# Patient Record
Sex: Male | Born: 1980 | Race: Black or African American | Hispanic: No | Marital: Married | Smoking: Never smoker
Health system: Southern US, Community
[De-identification: ages and names within clinical notes are randomized; demographics above are authoritative.]

## PROBLEM LIST (undated history)

## (undated) HISTORY — PX: TESTICLE REMOVAL: SHX68

---

## 2005-06-18 ENCOUNTER — Emergency Department (HOSPITAL_COMMUNITY): Admission: EM | Admit: 2005-06-18 | Discharge: 2005-06-18 | Payer: Self-pay | Admitting: Emergency Medicine

## 2005-06-22 ENCOUNTER — Emergency Department (HOSPITAL_COMMUNITY): Admission: EM | Admit: 2005-06-22 | Discharge: 2005-06-23 | Payer: Self-pay | Admitting: Emergency Medicine

## 2010-09-05 ENCOUNTER — Emergency Department (HOSPITAL_COMMUNITY): Admission: EM | Admit: 2010-09-05 | Discharge: 2010-09-05 | Payer: Self-pay | Admitting: Emergency Medicine

## 2011-02-13 LAB — URINALYSIS, ROUTINE W REFLEX MICROSCOPIC
Hgb urine dipstick: NEGATIVE
Ketones, ur: NEGATIVE mg/dL
Protein, ur: NEGATIVE mg/dL
Urobilinogen, UA: 1 mg/dL (ref 0.0–1.0)
pH: 7 (ref 5.0–8.0)

## 2011-02-13 LAB — GC/CHLAMYDIA PROBE AMP, GENITAL: Chlamydia, DNA Probe: NEGATIVE

## 2013-08-08 ENCOUNTER — Encounter (HOSPITAL_COMMUNITY): Payer: Self-pay | Admitting: *Deleted

## 2013-08-08 ENCOUNTER — Emergency Department (HOSPITAL_COMMUNITY): Payer: BC Managed Care – PPO

## 2013-08-08 ENCOUNTER — Emergency Department (HOSPITAL_COMMUNITY)
Admission: EM | Admit: 2013-08-08 | Discharge: 2013-08-08 | Disposition: A | Discharge status: Home / Self Care | Payer: BC Managed Care – PPO | Attending: Emergency Medicine | Admitting: Emergency Medicine

## 2013-08-08 DIAGNOSIS — IMO0002 Reserved for concepts with insufficient information to code with codable children: Secondary | ICD-10-CM | POA: Insufficient documentation

## 2013-08-08 DIAGNOSIS — S139XXA Sprain of joints and ligaments of unspecified parts of neck, initial encounter: Secondary | ICD-10-CM | POA: Insufficient documentation

## 2013-08-08 DIAGNOSIS — S161XXA Strain of muscle, fascia and tendon at neck level, initial encounter: Secondary | ICD-10-CM

## 2013-08-08 DIAGNOSIS — Y9289 Other specified places as the place of occurrence of the external cause: Secondary | ICD-10-CM | POA: Insufficient documentation

## 2013-08-08 DIAGNOSIS — Y9389 Activity, other specified: Secondary | ICD-10-CM | POA: Insufficient documentation

## 2013-08-08 DIAGNOSIS — Z79899 Other long term (current) drug therapy: Secondary | ICD-10-CM | POA: Insufficient documentation

## 2013-08-08 MED ORDER — METHOCARBAMOL 750 MG PO TABS: 750.0000 mg | ORAL_TABLET | Freq: Four times a day (QID) | ORAL | Status: DC | PRN

## 2013-08-08 MED ORDER — NAPROXEN 500 MG PO TABS: 500.0000 mg | ORAL_TABLET | Freq: Two times a day (BID) | ORAL | Status: DC | PRN

## 2013-08-08 MED ORDER — HYDROCODONE-ACETAMINOPHEN 5-325 MG PO TABS
2.0000 | ORAL_TABLET | Freq: Once | ORAL | Status: AC
  Administered 2013-08-08: 2 via ORAL
  Filled 2013-08-08: qty 2

## 2013-08-08 MED ORDER — DIAZEPAM 5 MG PO TABS
10.0000 mg | ORAL_TABLET | Freq: Once | ORAL | Status: AC
  Administered 2013-08-08: 10 mg via ORAL
  Filled 2013-08-08: qty 2

## 2013-08-08 MED ORDER — HYDROCODONE-ACETAMINOPHEN 5-325 MG PO TABS: 1.0000 | ORAL_TABLET | Freq: Four times a day (QID) | ORAL | Status: DC | PRN
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
## 2013-08-11 NOTE — ED Provider Notes (Signed)
Medical screening examination/treatment/procedure(s) were performed by non-physician practitioner and as supervising physician I was immediately available for consultation/collaboration.   Anyssa Sharpless M Daleon Willinger, DO 08/11/13 2259 
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
## 2016-09-13 ENCOUNTER — Ambulatory Visit (INDEPENDENT_AMBULATORY_CARE_PROVIDER_SITE_OTHER): Payer: Self-pay | Admitting: Family Medicine

## 2016-09-13 VITALS — BP 126/70 | HR 62 | Temp 97.3°F | Resp 18 | Ht 69.0 in | Wt 168.0 lb

## 2016-09-13 DIAGNOSIS — Z024 Encounter for examination for driving license: Secondary | ICD-10-CM

## 2016-09-13 NOTE — Progress Notes (Signed)
Commercial Driver Medical Examination   Steven Terry is a 35 y.o. male who presents today for a commercial driver fitness determination physical exam. The patient reports no problems. The following portions of the patient's history were reviewed and updated as appropriate: allergies, current medications, past family history, past medical history, past social history, past surgical history and problem list. Review of Systems A comprehensive review of systems was negative.   Objective:    Vision:   Visual Acuity Screening   Right eye Left eye Both eyes  Without correction: 20/13 20/13 20/13   With correction:     Comments: Titmus 85% Colors: 6/6      Applicant can recognize and distinguish among traffic control signals and devices showing standard red, green, and amber colors.     Monocular Vision?: No   Hearing:  Hearing Screening Comments: Whisper test 9 ft  BP 126/70   Pulse 62   Temp 97.3 F (36.3 C) (Oral)   Resp 18   Ht 5\' 9"  (1.753 m)   Wt 168 lb (76.2 kg)   SpO2 100%   BMI 24.81 kg/m   General Appearance:    Alert, cooperative, no distress, appears stated age  Head:    Normocephalic, without obvious abnormality, atraumatic  Eyes:    PERRL, conjunctiva/corneas clear, EOM's intact, fundi    benign, both eyes       Ears:    Normal TM's and external ear canals, both ears  Nose:   Nares normal, septum midline, mucosa normal, no drainage    or sinus tenderness  Throat:   Lips, mucosa, and tongue normal; teeth and gums normal  Neck:   Supple, symmetrical, trachea midline, no adenopathy;       thyroid:  No enlargement/tenderness/nodules; no carotid   bruit or JVD  Back:     Symmetric, no curvature, ROM normal, no CVA tenderness  Lungs:     Clear to auscultation bilaterally, respirations unlabored  Chest wall:    No tenderness or deformity  Heart:    Regular rate and rhythm, II/VI systolic ejection murmur LUSB - no alarm sxs  Abdomen:     Soft, non-tender, bowel  sounds active all four quadrants,    no masses, no organomegaly        Extremities:   Extremities normal, atraumatic, no cyanosis or edema  Pulses:   2+ and symmetric all extremities  Skin:   Skin color, texture, turgor normal, no rashes or lesions  Lymph nodes:   Cervical, supraclavicular, and axillary nodes normal  Neurologic:   CNII-XII intact. Normal strength, sensation and reflexes      throughout    Labs:   Assessment:    Healthy male exam.  Meets standards in 9349 CFR 391.41;  qualifies for 2 year certificate.    Plan:    Medical examiners certificate completed and printed. Return as needed.

## 2016-09-13 NOTE — Patient Instructions (Signed)
     IF you received an x-ray today, you will receive an invoice from [facility]. Please contact [facility] at [phone] with questions or concerns regarding your invoice.   IF you received labwork today, you will receive an invoice from [facility]. Please contact [facility] at [phone] with questions or concerns regarding your invoice.   Our billing staff will not be able to assist you with questions regarding bills from these companies.  You will be contacted with the lab results as soon as they are available. The fastest way to get your results is to activate your My Chart account. Instructions are located on the last page of this paperwork. If you have not heard from us regarding the results in 2 weeks, please contact this office.      

## 2019-04-12 ENCOUNTER — Encounter (HOSPITAL_COMMUNITY): Payer: Self-pay

## 2019-04-12 ENCOUNTER — Ambulatory Visit (HOSPITAL_COMMUNITY)
Admission: EM | Admit: 2019-04-12 | Discharge: 2019-04-12 | Disposition: A | Discharge status: Home / Self Care | Payer: BLUE CROSS/BLUE SHIELD | Attending: Family Medicine | Admitting: Family Medicine

## 2019-04-12 ENCOUNTER — Other Ambulatory Visit: Payer: Self-pay

## 2019-04-12 ENCOUNTER — Ambulatory Visit (INDEPENDENT_AMBULATORY_CARE_PROVIDER_SITE_OTHER): Payer: BLUE CROSS/BLUE SHIELD

## 2019-04-12 ENCOUNTER — Telehealth: Payer: BLUE CROSS/BLUE SHIELD | Admitting: Family Medicine

## 2019-04-12 DIAGNOSIS — Z20828 Contact with and (suspected) exposure to other viral communicable diseases: Secondary | ICD-10-CM

## 2019-04-12 DIAGNOSIS — R509 Fever, unspecified: Secondary | ICD-10-CM | POA: Diagnosis not present

## 2019-04-12 DIAGNOSIS — R5383 Other fatigue: Secondary | ICD-10-CM | POA: Diagnosis not present

## 2019-04-12 DIAGNOSIS — Z20822 Contact with and (suspected) exposure to covid-19: Secondary | ICD-10-CM

## 2019-04-12 DIAGNOSIS — R0602 Shortness of breath: Secondary | ICD-10-CM | POA: Diagnosis not present

## 2019-04-12 NOTE — Discharge Instructions (Addendum)
I am unable to order COVID testing for you. I think there is a good likelihood this is a COVID-19 infection [facility] testing on select individuals.  Call [phone] for information If you have worsening shortness of breath and illness, you should go to the emergency room where testing is done For now you need to quarantine at home.  Avoid contact with your family I will write a note for your work Do not take ibuprofen or Aleve.  Is preferred to take Tylenol for pain and fever Rest and drink plenty of fluids You cannot return to work until you have had no fever for 72 hours, and at least 7 days from her symptom onset I recommend the [facility] website for an E- visit if you have questions, problems, or need an extension of a work note    Person Under Monitoring Name: Steven Terry  Location: [address] [place] [id number]   Infection Prevention Recommendations for Individuals Confirmed to have, or Being Evaluated for, 2019 Novel Coronavirus (COVID-19) Infection Who Receive Care at Home  Individuals who are confirmed to have, or are being evaluated for, COVID-19 should follow the prevention steps below until a healthcare provider or local or state health department says they can return to normal activities.  Stay home except to get medical care You should restrict activities outside your home, except for getting medical care. Do not go to work, school, or public areas, and do not use public transportation or taxis.  Call ahead before visiting your doctor Before your medical appointment, call the healthcare provider and tell them that you have, or are being evaluated for, COVID-19 infection. This will help the healthcare providers office take steps to keep other people from getting infected. Ask your healthcare provider to call the local or state health department.  Monitor your symptoms Seek prompt medical attention if your  illness is worsening (e.g., difficulty breathing). Before going to your medical appointment, call the healthcare provider and tell them that you have, or are being evaluated for, COVID-19 infection. Ask your healthcare provider to call the local or state health department.  Wear a facemask You should wear a facemask that covers your nose and mouth when you are in the same room with other people and when you visit a healthcare provider. People who live with or visit you should also wear a facemask while they are in the same room with you.  Separate yourself from other people in your home As much as possible, you should stay in a different room from other people in your home. Also, you should use a separate bathroom, if available.  Avoid sharing household items You should not share dishes, drinking glasses, cups, eating utensils, towels, bedding, or other items with other people in your home. After using these items, you should wash them thoroughly with soap and water.  Cover your coughs and sneezes Cover your mouth and nose with a tissue when you cough or sneeze, or you can cough or sneeze into your sleeve. Throw used tissues in a lined trash can, and immediately wash your hands with soap and water for at least 20 seconds or use an alcohol-based hand rub.  Wash your [employer] Wash your hands often and thoroughly with soap and water for at least 20 seconds. You can use an alcohol-based hand sanitizer if soap and water are not available and if your hands are not visibly dirty. Avoid touching your eyes, nose,  and mouth with unwashed hands.   Prevention Steps for Caregivers and Household Members of Individuals Confirmed to have, or Being Evaluated for, COVID-19 Infection Being Cared for in the Home  If you live with, or provide care at home for, a person confirmed to have, or being evaluated for, COVID-19 infection please follow these guidelines to prevent infection:  Follow healthcare  providers instructions Make sure that you understand and can help the patient follow any healthcare provider instructions for all care.  Provide for the patients basic needs You should help the patient with basic needs in the home and provide support for getting groceries, prescriptions, and other personal needs.  Monitor the patients symptoms If they are getting sicker, call his or her medical provider and tell them that the patient has, or is being evaluated for, COVID-19 infection. This will help the healthcare providers office take steps to keep other people from getting infected. Ask the healthcare provider to call the local or state health department.  Limit the number of people who have contact with the patient If possible, have only one caregiver for the patient. Other household members should stay in another home or place of residence. If this is not possible, they should stay in another room, or be separated from the patient as much as possible. Use a separate bathroom, if available. Restrict visitors who do not have an essential need to be in the home.  Keep older adults, very young children, and other sick people away from the patient Keep older adults, very young children, and those who have compromised immune systems or chronic health conditions away from the patient. This includes people with chronic heart, lung, or kidney conditions, diabetes, and cancer.  Ensure good ventilation Make sure that shared spaces in the home have good air flow, such as from an air conditioner or an opened window, weather permitting.  Wash your hands often Wash your hands often and thoroughly with soap and water for at least 20 seconds. You can use an alcohol based hand sanitizer if soap and water are not available and if your hands are not visibly dirty. Avoid touching your eyes, nose, and mouth with unwashed hands. Use disposable paper towels to dry your hands. If not available, use  dedicated cloth towels and replace them when they become wet.  Wear a facemask and gloves Wear a disposable facemask at all times in the room and gloves when you touch or have contact with the patients blood, body fluids, and/or secretions or excretions, such as sweat, saliva, sputum, nasal mucus, vomit, urine, or feces.  Ensure the mask fits over your nose and mouth tightly, and do not touch it during use. Throw out disposable facemasks and gloves after using them. Do not reuse. Wash your hands immediately after removing your facemask and gloves. If your personal clothing becomes contaminated, carefully remove clothing and launder. Wash your hands after handling contaminated clothing. Place all used disposable facemasks, gloves, and other waste in a lined container before disposing them with other household waste. Remove gloves and wash your hands immediately after handling these items.  Do not share dishes, glasses, or other household items with the patient Avoid sharing household items. You should not share dishes, drinking glasses, cups, eating utensils, towels, bedding, or other items with a patient who is confirmed to have, or being evaluated for, COVID-19 infection. After the person uses these items, you should wash them thoroughly with soap and water.  Wash laundry thoroughly Immediately remove and  wash clothes or bedding that have blood, body fluids, and/or secretions or excretions, such as sweat, saliva, sputum, nasal mucus, vomit, urine, or feces, on them. Wear gloves when handling laundry from the patient. Read and follow directions on labels of laundry or clothing items and detergent. In general, wash and dry with the warmest temperatures recommended on the label.  Clean all areas the individual has used often Clean all touchable surfaces, such as counters, tabletops, doorknobs, bathroom fixtures, toilets, phones, keyboards, tablets, and bedside tables, every day. Also, clean any  surfaces that may have blood, body fluids, and/or secretions or excretions on them. Wear gloves when cleaning surfaces the patient has come in contact with. Use a diluted bleach solution (e.g., dilute bleach with 1 part bleach and 10 parts water) or a household disinfectant with a label that says EPA-registered for coronaviruses. To make a bleach solution at home, add 1 tablespoon of bleach to 1 quart (4 cups) of water. For a larger supply, add  cup of bleach to 1 gallon (16 cups) of water. Read labels of cleaning products and follow recommendations provided on product labels. Labels contain instructions for safe and effective use of the cleaning product including precautions you should take when applying the product, such as wearing gloves or eye protection and making sure you have good ventilation during use of the product. Remove gloves and wash hands immediately after cleaning.  Monitor yourself for signs and symptoms of illness Caregivers and household members are considered close contacts, should monitor their health, and will be asked to limit movement outside of the home to the extent possible. Follow the monitoring steps for close contacts listed on the symptom monitoring form.   ? If you have additional questions, contact your local health department or call the epidemiologist on call at [phone] (available 24/7). ? This guidance is subject to change. For the most up-to-date guidance from [facility], please refer to their website: [email or web address]

## 2019-04-12 NOTE — ED Provider Notes (Signed)
[facility]    CSN: [id number] Arrival date & time: 04/12/19  1029     History   Chief Complaint Chief Complaint  Patient presents with  . bodyaches    HPI Steven Terry is a 38 y.o. male.   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  On no medications.  He is working full-time through the coronavirus pandemic.  He wears a mask every day at work.  Does not wear at home with his wife and 7834-month-old daughter.  No known exposure to coronavirus.  He does have a lot of exposure to people, packages, items. He states that for the last 3 days he has had fever to 101 degrees, body aches, weakness and fatigue.  He states that when he tries to exercise he becomes winded easily.  Not short of breath at rest.  Mild cough with deep inspiration.  No sputum production.  No chest pain.  No runny stuffy nose.  Normal sense of taste and smell.  No nausea vomiting diarrhea.  Appetite is adequate but not vigorous.  History reviewed. No pertinent past medical history.  There are no active problems to display for this patient.   Past Surgical History:  Procedure Laterality Date  . TESTICLE REMOVAL Left        Home Medications    Prior to Admission medications   Medication Sig Start Date End Date Taking? Authorizing Provider  CREATINE PO Take 1 application by mouth once a week.    [provider]  ibuprofen (ADVIL,MOTRIN) 200 MG tablet Take 200 mg by mouth every 6 (six) hours as needed for pain.    [provider]  Multiple Vitamin (MULTIVITAMIN WITH MINERALS) TABS tablet Take 1 tablet by mouth daily.    [provider]  naphazoline-glycerin (CLEAR EYES) 0.012-0.2 % SOLN Place 1-2 drops into both eyes every 4 (four) hours as needed.    [provider]    Family History History reviewed. No pertinent family history.  Social History Social History   Tobacco Use  . Smoking status: Never Smoker  . Smokeless tobacco: Never Used   Substance Use Topics  . Alcohol use: Never    Frequency: Never  . Drug use: Never     Allergies   Other   Review of Systems Review of Systems  Constitutional: Positive for appetite change, fatigue and fever. Negative for chills.  HENT: Negative for ear pain and sore throat.   Eyes: Negative for pain and visual disturbance.  Respiratory: Positive for shortness of breath. Negative for cough.   Cardiovascular: Negative for chest pain and palpitations.  Gastrointestinal: Negative for abdominal pain, diarrhea, nausea and vomiting.  Genitourinary: Negative for dysuria and hematuria.  Musculoskeletal: Positive for myalgias. Negative for arthralgias and back pain.  Skin: Negative for color change and rash.  Neurological: Negative for seizures, syncope and headaches.  All other systems reviewed and are negative.    Physical Exam Triage Vital Signs ED Triage Vitals  Enc Vitals Group     BP 04/12/19 1058 133/90     Pulse Rate 04/12/19 1058 64     Resp 04/12/19 1058 16     Temp 04/12/19 1058 98 F (36.7 C)     Temp src --      SpO2 04/12/19 1058 99 %     Weight 04/12/19 1055 175 lb (79.4 kg)     Height --      Head Circumference --  Peak Flow --      Pain Score 04/12/19 1055 8     Pain Loc --      Pain Edu? --      Excl. in GC? --    No data found.  Updated Vital Signs BP 133/90 (BP Location: Right Arm)   Pulse 64   Temp 98 F (36.7 C)   Resp 16   Wt 79.4 kg   SpO2 99%   BMI 25.84 kg/m      Physical Exam Constitutional:      General: He is not in acute distress.    Appearance: Normal appearance. He is well-developed. He is ill-appearing.  HENT:     Head: Normocephalic and atraumatic.     Right Ear: Tympanic membrane and ear canal normal.     Left Ear: Tympanic membrane and ear canal normal.     Nose: Nose normal.     Mouth/Throat:     Mouth: Mucous membranes are moist.     Pharynx: No posterior oropharyngeal erythema.  Eyes:     Conjunctiva/sclera:  Conjunctivae normal.     Pupils: Pupils are equal, round, and reactive to light.  Neck:     Musculoskeletal: Normal range of motion.  Cardiovascular:     Rate and Rhythm: Normal rate and regular rhythm.     Heart sounds: Normal heart sounds.  Pulmonary:     Effort: Pulmonary effort is normal. No respiratory distress.     Comments: Few scattered expiratory wheezes heard with cough, inspiration clear Abdominal:     General: There is no distension.     Palpations: Abdomen is soft.  Musculoskeletal: Normal range of motion.  Lymphadenopathy:     Cervical: No cervical adenopathy.  Skin:    General: Skin is warm and dry.  Neurological:     General: No focal deficit present.     Mental Status: He is alert.  Psychiatric:        Mood and Affect: Mood normal.        Behavior: Behavior normal.      UC Treatments / Results  Labs (all labs ordered are listed, but only abnormal results are displayed) Labs Reviewed - No data to display  EKG None  Radiology Dg Chest 2 View  Result Date: 04/12/2019 CLINICAL DATA:  Shortness of breath for the past 3 days. EXAM: CHEST - 2 VIEW COMPARISON:  Chest x-ray dated June 18, 2005. FINDINGS: The heart size and mediastinal contours are within normal limits. Both lungs are clear. The visualized skeletal structures are unremarkable. IMPRESSION: No active cardiopulmonary disease. Electronically Signed   By: Obie Dredge M.D.   On: 04/12/2019 11:56    Procedures Procedures (including critical care time)  Medications Ordered in UC Medications - No data to display  Initial Impression / Assessment and Plan / UC Course  I have reviewed the triage vital signs and the nursing notes.  Pertinent labs & imaging results that were available during my care of the patient were reviewed by me and considered in my medical decision making (see chart for details).     I discussed with this patient that a fever to 101 body aches and shortness of breath in  today's current environment makes him a COVID-19 suspect.  We discussed home management.  We discussed quarantine.  We discussed recovery.  Reasons for return.  Places to get COVID testing. Final Clinical Impressions(s) / UC Diagnoses   Final diagnoses:  Suspected Covid-19 Virus Infection  Discharge Instructions     I am unable to order COVID testing for you. I think there is a good likelihood this is a COVID-19 infection [facility] Department of Health is doing testing on select individuals.  Call [phone] for information If you have worsening shortness of breath and illness, you should go to the emergency room where testing is done For now you need to quarantine at home.  Avoid contact with your family I will write a note for your work Do not take ibuprofen or Aleve.  Is preferred to take Tylenol for pain and fever Rest and drink plenty of fluids You cannot return to work until you have had no fever for 72 hours, and at least 7 days from her symptom onset I recommend the [facility] website for an E- visit if you have questions, problems, or need an extension of a work note    Person Under Monitoring Name: Owen Caughlin  Location: [address] [place] [address]   Infection Prevention Recommendations for Individuals Confirmed to have, or Being Evaluated for, 2019 Novel Coronavirus (COVID-19) Infection Who Receive Care at Home  Individuals who are confirmed to have, or are being evaluated for, COVID-19 should follow the prevention steps below until a healthcare provider or local or state health department says they can return to normal activities.  Stay home except to get medical care You should restrict activities outside your home, except for getting medical care. Do not go to work, school, or public areas, and do not use public transportation or taxis.  Call ahead before visiting your doctor Before your medical appointment, call the healthcare  provider and tell them that you have, or are being evaluated for, COVID-19 infection. This will help the healthcare provider's office take steps to keep other people from getting infected. Ask your healthcare provider to call the local or state health department.  Monitor your symptoms Seek prompt medical attention if your illness is worsening (e.g., difficulty breathing). Before going to your medical appointment, call the healthcare provider and tell them that you have, or are being evaluated for, COVID-19 infection. Ask your healthcare provider to call the local or state health department.  Wear a facemask You should wear a facemask that covers your nose and mouth when you are in the same room with other people and when you visit a healthcare provider. People who live with or visit you should also wear a facemask while they are in the same room with you.  Separate yourself from other people in your home As much as possible, you should stay in a different room from other people in your home. Also, you should use a separate bathroom, if available.  Avoid sharing household items You should not share dishes, drinking glasses, cups, eating utensils, towels, bedding, or other items with other people in your home. After using these items, you should wash them thoroughly with soap and water.  Cover your coughs and sneezes Cover your mouth and nose with a tissue when you cough or sneeze, or you can cough or sneeze into your sleeve. Throw used tissues in a lined trash can, and immediately wash your hands with soap and water for at least 20 seconds or use an alcohol-based hand rub.  Wash your [employer] your hands often and thoroughly with soap and water for at least 20 seconds. You can use an alcohol-based hand sanitizer if soap and water are not available and if your hands are not visibly  dirty. Avoid touching your eyes, nose, and mouth with unwashed hands.   Prevention Steps for Caregivers  and Household Members of Individuals Confirmed to have, or Being Evaluated for, COVID-19 Infection Being Cared for in the Home  If you live with, or provide care at home for, a person confirmed to have, or being evaluated for, COVID-19 infection please follow these guidelines to prevent infection:  Follow healthcare provider's instructions Make sure that you understand and can help the patient follow any healthcare provider instructions for all care.  Provide for the patient's basic needs You should help the patient with basic needs in the home and provide support for getting groceries, prescriptions, and other personal needs.  Monitor the patient's symptoms If they are getting sicker, call his or her medical provider and tell them that the patient has, or is being evaluated for, COVID-19 infection. This will help the healthcare provider's office take steps to keep other people from getting infected. Ask the healthcare provider to call the local or state health department.  Limit the number of people who have contact with the patient  If possible, have only one caregiver for the patient.  Other household members should stay in another home or place of residence. If this is not possible, they should stay  in another room, or be separated from the patient as much as possible. Use a separate bathroom, if available.  Restrict visitors who do not have an essential need to be in the home.  Keep older adults, very young children, and other sick people away from the patient Keep older adults, very young children, and those who have compromised immune systems or chronic health conditions away from the patient. This includes people with chronic heart, lung, or kidney conditions, diabetes, and cancer.  Ensure good ventilation Make sure that shared spaces in the home have good air flow, such as from an air conditioner or an opened window, weather permitting.  Wash your hands often  Wash your  hands often and thoroughly with soap and water for at least 20 seconds. You can use an alcohol based hand sanitizer if soap and water are not available and if your hands are not visibly dirty.  Avoid touching your eyes, nose, and mouth with unwashed hands.  Use disposable paper towels to dry your hands. If not available, use dedicated cloth towels and replace them when they become wet.  Wear a facemask and gloves  Wear a disposable facemask at all times in the room and gloves when you touch or have contact with the patient's blood, body fluids, and/or secretions or excretions, such as sweat, saliva, sputum, nasal mucus, vomit, urine, or feces.  Ensure the mask fits over your nose and mouth tightly, and do not touch it during use.  Throw out disposable facemasks and gloves after using them. Do not reuse.  Wash your hands immediately after removing your facemask and gloves.  If your personal clothing becomes contaminated, carefully remove clothing and launder. Wash your hands after handling contaminated clothing.  Place all used disposable facemasks, gloves, and other waste in a lined container before disposing them with other household waste.  Remove gloves and wash your hands immediately after handling these items.  Do not share dishes, glasses, or other household items with the patient  Avoid sharing household items. You should not share dishes, drinking glasses, cups, eating utensils, towels, bedding, or other items with a patient who is confirmed to have, or being evaluated for, COVID-19 infection.  After  the person uses these items, you should wash them thoroughly with soap and water.  Wash laundry thoroughly  Immediately remove and wash clothes or bedding that have blood, body fluids, and/or secretions or excretions, such as sweat, saliva, sputum, nasal mucus, vomit, urine, or feces, on them.  Wear gloves when handling laundry from the patient.  Read and follow directions on  labels of laundry or clothing items and detergent. In general, wash and dry with the warmest temperatures recommended on the label.  Clean all areas the individual has used often  Clean all touchable surfaces, such as counters, tabletops, doorknobs, bathroom fixtures, toilets, phones, keyboards, tablets, and bedside tables, every day. Also, clean any surfaces that may have blood, body fluids, and/or secretions or excretions on them.  Wear gloves when cleaning surfaces the patient has come in contact with.  Use a diluted bleach solution (e.g., dilute bleach with 1 part bleach and 10 parts water) or a household disinfectant with a label that says EPA-registered for coronaviruses. To make a bleach solution at home, add 1 tablespoon of bleach to 1 quart (4 cups) of water. For a larger supply, add  cup of bleach to 1 gallon (16 cups) of water.  Read labels of cleaning products and follow recommendations provided on product labels. Labels contain instructions for safe and effective use of the cleaning product including precautions you should take when applying the product, such as wearing gloves or eye protection and making sure you have good ventilation during use of the product.  Remove gloves and wash hands immediately after cleaning.  Monitor yourself for signs and symptoms of illness Caregivers and household members are considered close contacts, should monitor their health, and will be asked to limit movement outside of the home to the extent possible. Follow the monitoring steps for close contacts listed on the symptom monitoring form.   ? If you have additional questions, contact your local health department or call the epidemiologist on call at [phone] (available 24/7). ? This guidance is subject to change. For the most up-to-date guidance from [facility], please refer to their website: TripMetro.hu    ED Prescriptions    None      Controlled Substance Prescriptions  Controlled Substance Registry consulted? Not Applicable   Eustace Moore, MD 04/12/19 1250

## 2019-04-12 NOTE — ED Triage Notes (Signed)
Pt cc he has a bodyaches , weak and SOB. This has been going on for about 3 days.  Pt states he has been taking tylenol for pain. Pt needs a work note.

## 2019-04-19 ENCOUNTER — Telehealth (HOSPITAL_COMMUNITY): Payer: Self-pay | Admitting: Emergency Medicine

## 2019-04-19 ENCOUNTER — Telehealth: Payer: BLUE CROSS/BLUE SHIELD | Admitting: Physician Assistant

## 2019-04-19 DIAGNOSIS — R0602 Shortness of breath: Secondary | ICD-10-CM

## 2019-04-19 DIAGNOSIS — R519 Headache, unspecified: Secondary | ICD-10-CM

## 2019-04-19 DIAGNOSIS — R202 Paresthesia of skin: Secondary | ICD-10-CM

## 2019-04-19 NOTE — Progress Notes (Signed)
Based on what you shared with me, I feel your condition warrants further evaluation and I recommend that you be seen for a face to face office visit. From the sound of you complaint it would be best to go straight to the emergency department for a thorough eval.    NOTE: If you entered your credit card information for this eVisit, you will not be charged. You may see a "hold" on your card for the $35 but that hold will drop off and you will not have a charge processed.  If you are having a true medical emergency please call 911.If you need an urgent face to face visit, [place] has four urgent care centers for your convenience.  PLEASE NOTE: THE [facility] LOCATIONS AND URGENT CARE CLINICS DO NOT HAVE THE TESTING FOR CORONAVIRUS COVID19 AVAILABLE.IF YOU FEEL YOU NEED THIS TEST YOU MUST GO TO A TRIAGE LOCATION AT ONE OF THE HOSPITAL EMERGENCY DEPARTMENTS ?  [email or web address] to reserve your spot online an avoid wait times  [facility] [address] [place], [place] [address] Modified hours of operation: Monday-Friday, 12 PM to 6 PM  Saturday & Sunday 10 AM to 4 PM *Across the street from [employer]  [facility] (New Address!) [address], [address] [place], [place] [address] *Just off [address], across the road from [place]* Modified hours of operation: Monday-Friday, 12 PM to 6 PM  Closed Saturday & Sunday  [facility]'s modified hours of operation will be in effect from May 1 until May 31   The following sites will take your insurance:  [facility]  [phone] Get Driving Directions Find a Provider at this Location  [address] [place], [place] [address] 10 am to 8 pm Monday-Friday 12 pm to 8 pm [facility] at [facility]  [phone] Get Driving Directions Find a Provider at this Location  [address] [place], [place] [address] 8 am to 8 pm Monday-Friday 9 am to 6 pm Saturday 11 am to 6 pm Sunday   [facility] at [facility]  [phone] Get Driving Directions  [address] [place], [place] [address] 8 am to 8 pm Monday-Friday 8 am to 4 pm Saturday-Sunday   Your e-visit answers were reviewed by a board certified advanced clinical practitioner to complete your personal care plan.Thank you for using e-Visits.   ----- Message -----  From: Steven Terry  Sent: 5/19/20201:16 PM EDT  To: E-Visit Mailing List Subject: E-Visit Submission: Upper Respiratory Infection  E-Visit Submission: Upper Respiratory Infection --------------------------------  Question: Which of the following are you experiencing? Answer: Headache  Question: How long have you been having these symptoms? Answer: 7 or more days  Question: Do you have a fever? Answer: Yes, I have a low fever (less than 101 degrees)  Question: How long have you had the fever? Answer: Just today  Question: Are you in close contact with anyone who has similar symptoms ? Answer: Yes  Question: Are you treated for any of the following conditions: Asthma, COPD, Diabetes, Renal Failure (on Dialysis), AIDS, any Neuromuscular disease that effects the clearing of secretions, Heart Failure, or Heart Disease? Answer: No  Question: Are you taking any over the counter medications for your symptoms? Answer: No  Question: Please list your medication allergies that you may have ? (If 'none' , please list as 'none') Answer: None  Question: Please list any additional comments  Answer: Symptoms that were not listed are as follows: chest and arm tightness and burning. Lack of energy. Shortness of breath. Burning sensation throughout body. Shooting sensation in veins periodically from left wrist into left hand.  A total of 5-10 minutes was spent evaluating this patients  questionnaire and formulating a plan of care.

## 2019-04-19 NOTE — Telephone Encounter (Signed)
Patient was requesting a work note.  He received covid test results today, stating covid negative.  Patient reports he is feeling better, but "not 100%, yet".  Patient also mentioned wife is getting sick now.  Patient has e-visit from today in computer instructing patient to go to ED.  Patient refuses to go to ED as instructed.  Patient says he has another e-visit with pcp tomorrow.  Provided patient a note to return to work tomorrow (received negative results today).  Instructed further notes to come from tomorrows e-visit/pcp.    Patient was offered a ucc visit, but declined.

## 2019-04-28 ENCOUNTER — Ambulatory Visit: Payer: BLUE CROSS/BLUE SHIELD | Admitting: Family Medicine

## 2019-04-28 ENCOUNTER — Ambulatory Visit: Payer: BLUE CROSS/BLUE SHIELD

## 2020-07-09 IMAGING — DX CHEST - 2 VIEW
2 series · 2 of 2 positions shown · non-contrast
Comparison: Chest x-ray dated June 18, 2005.

CLINICAL DATA: Shortness of breath for the past 3 days.

EXAM:
CHEST - 2 VIEW

[chest pa]
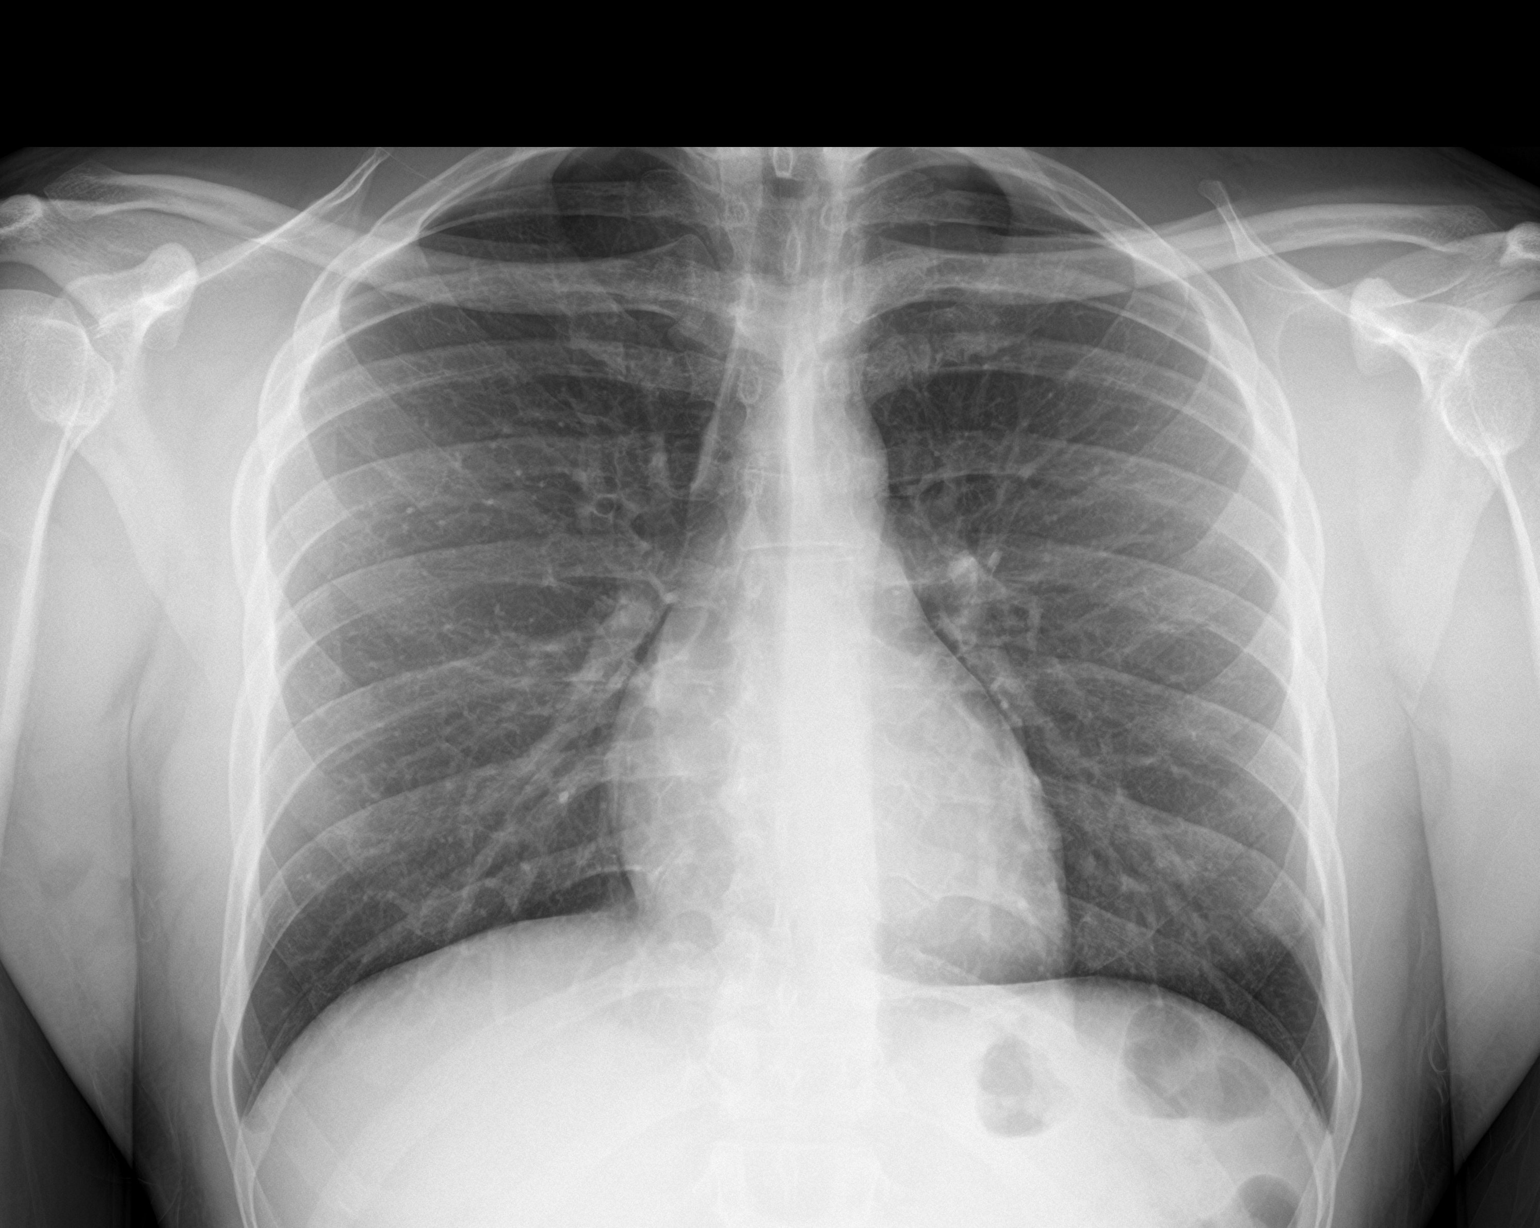

[chest lat]
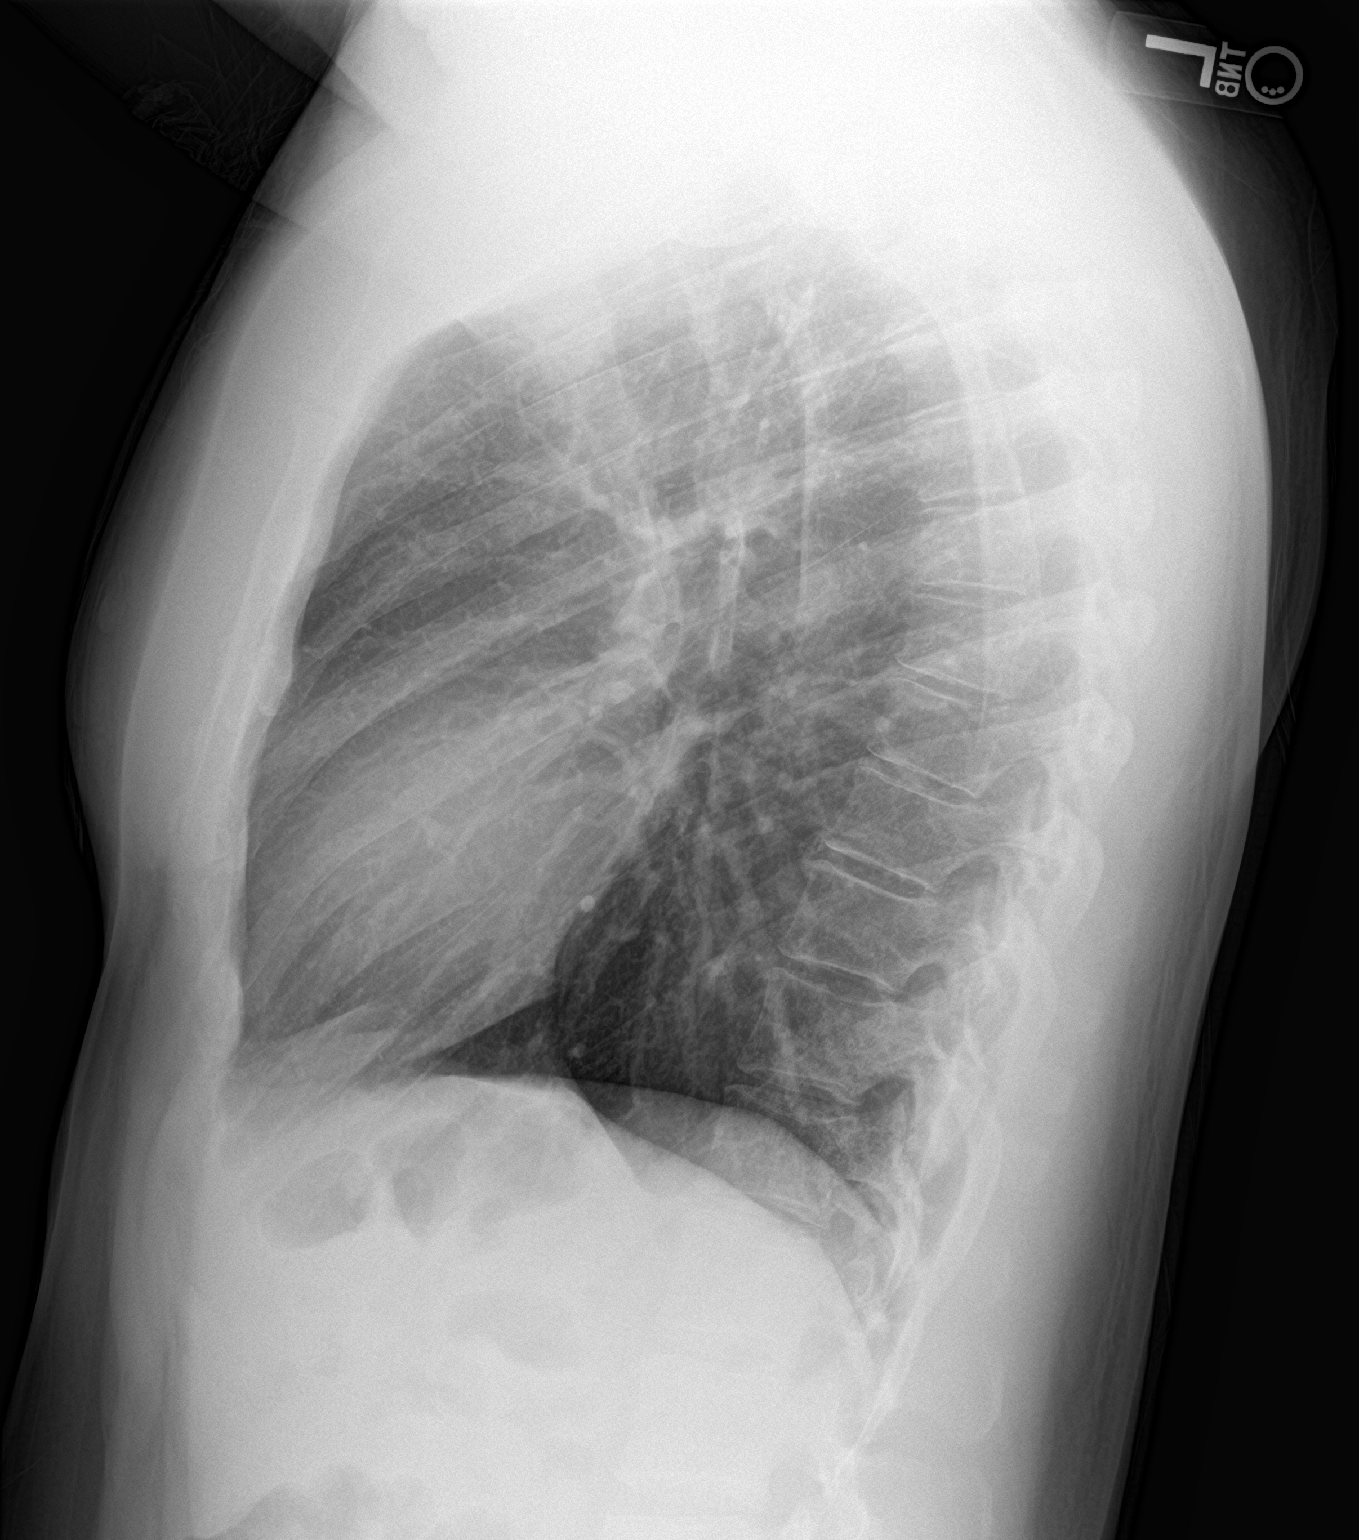

[2 of 2 positions shown; findings below may reference images not displayed]

FINDINGS: The heart size and mediastinal contours are within normal limits.
Both lungs are clear. The visualized skeletal structures are
unremarkable.
IMPRESSION: No active cardiopulmonary disease.

## 2020-08-14 ENCOUNTER — Ambulatory Visit: Payer: Self-pay | Admitting: Family Medicine
